# Patient Record
Sex: Male | Born: 1995 | Hispanic: No | Marital: Single | Smoking: Never smoker
Health system: Southern US, Community
[De-identification: ages and names within clinical notes are randomized; demographics above are authoritative.]

## PROBLEM LIST (undated history)

## (undated) DIAGNOSIS — H1010 Acute atopic conjunctivitis, unspecified eye: Secondary | ICD-10-CM

## (undated) DIAGNOSIS — L709 Acne, unspecified: Secondary | ICD-10-CM

## (undated) HISTORY — DX: Acne, unspecified: L70.9

## (undated) HISTORY — DX: Acute atopic conjunctivitis, unspecified eye: H10.10

---
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
## 2016-10-10 ENCOUNTER — Encounter: Payer: Self-pay | Admitting: Student

## 2016-10-10 ENCOUNTER — Ambulatory Visit (INDEPENDENT_AMBULATORY_CARE_PROVIDER_SITE_OTHER): Payer: Medicaid Other | Admitting: Student

## 2016-10-10 VITALS — BP 100/74 | Ht 65.0 in | Wt 181.6 lb

## 2016-10-10 DIAGNOSIS — Z113 Encounter for screening for infections with a predominantly sexual mode of transmission: Secondary | ICD-10-CM | POA: Diagnosis not present

## 2016-10-10 DIAGNOSIS — Z0001 Encounter for general adult medical examination with abnormal findings: Secondary | ICD-10-CM

## 2016-10-10 DIAGNOSIS — Z68.41 Body mass index (BMI) pediatric, 85th percentile to less than 95th percentile for age: Secondary | ICD-10-CM | POA: Diagnosis not present

## 2016-10-10 DIAGNOSIS — L700 Acne vulgaris: Secondary | ICD-10-CM

## 2016-10-10 DIAGNOSIS — E669 Obesity, unspecified: Secondary | ICD-10-CM | POA: Insufficient documentation

## 2016-10-10 DIAGNOSIS — E663 Overweight: Secondary | ICD-10-CM | POA: Diagnosis not present

## 2016-10-10 DIAGNOSIS — L853 Xerosis cutis: Secondary | ICD-10-CM

## 2016-10-10 LAB — LIPID PANEL
Cholesterol: 150 mg/dL (ref <170)
HDL: 35 mg/dL — ABNORMAL LOW (ref >45)
LDL CALC: 101 mg/dL (ref <110)
TRIGLYCERIDES: 69 mg/dL (ref <90)
Total CHOL/HDL Ratio: 4.3 Ratio (ref <5.0)
VLDL: 14 mg/dL (ref <30)

## 2016-10-10 LAB — COMPREHENSIVE METABOLIC PANEL
ALT: 20 U/L (ref 8–46)
AST: 27 U/L (ref 12–32)
Albumin: 4.3 g/dL (ref 3.6–5.1)
Alkaline Phosphatase: 76 U/L (ref 48–230)
BUN: 18 mg/dL (ref 7–20)
CO2: 26 mmol/L (ref 20–31)
Calcium: 9.4 mg/dL (ref 8.9–10.4)
Chloride: 102 mmol/L (ref 98–110)
Creat: 1.07 mg/dL (ref 0.60–1.26)
GLUCOSE: 87 mg/dL (ref 65–99)
Potassium: 4.1 mmol/L (ref 3.8–5.1)
SODIUM: 137 mmol/L (ref 135–146)
TOTAL PROTEIN: 7.5 g/dL (ref 6.3–8.2)
Total Bilirubin: 0.3 mg/dL (ref 0.2–1.1)

## 2016-10-10 LAB — POCT RAPID HIV: Rapid HIV, POC: NEGATIVE

## 2016-10-10 MED ORDER — ADAPALENE 0.1 % EX GEL: Freq: Every day | CUTANEOUS | 0 refills | Status: DC

## 2016-10-10 NOTE — Patient Instructions (Signed)
Acne Plan  Products: Face Wash:  Use a gentle cleanser, such as Cetaphil (generic version of this is fine) Moisturizer:  Use an "oil-free" moisturizer with SPF Prescription Cream(s): differin at bedtime  Morning: Wash face, then completely dry Apply Moisturizer to entire face  Bedtime: Wash face, then completely dry Apply small, pea size amount that you massage into problem areas on the face.  Remember: - Your acne will probably get worse before it gets better - It takes at least 2 months for the medicines to start working - Use oil free soaps and lotions; these can be over the counter or store-brand - Don't use harsh scrubs or astringents, these can make skin irritation and acne worse - Moisturize daily with oil free lotion because the acne medicines will dry your skin  Call your doctor if you have: - Lots of skin dryness or redness that doesn't get better if you use a moisturizer or if you use the prescription cream or lotion every other day    Stop using the acne medicine immediately and see your doctor if you are or become pregnant or if you think you had an allergic reaction (itchy rash, difficulty breathing, nausea, vomiting) to your acne medication.  Diet Recommendations   Starchy (carb) foods include: Bread, rice, pasta, potatoes, corn, crackers, bagels, muffins, all baked goods.   Protein foods include: Meat, fish, poultry, eggs, dairy foods, and beans such as pinto and kidney beans (beans also provide carbohydrate).   1. Eat at least 3 meals and 1-2 snacks per day. Never go more than 4-5 hours while     awake without eating.  2. Limit starchy foods to TWO per meal and ONE per snack. ONE portion of a starchy     food is equal to the following:  - ONE slice of bread (or its equivalent, such as half of a hamburger bun).  - 1/2 cup of a "scoopable" starchy food such as potatoes or rice.  - 1 OUNCE (28 grams) of starchy snack foods  such as crackers or pretzels (look     on label).  - 15 grams of carbohydrate as shown on food label.  3. Both lunch and dinner should include a protein food, a carb food, and vegetables.  - Obtain twice as many veg's as protein or carbohydrate foods for both lunch and     dinner.  - Try to keep frozen veg's on hand for a quick vegetable serving.  - Fresh or frozen veg's are best.  4. Breakfast should always include protein

## 2016-10-10 NOTE — Progress Notes (Signed)
Adolescent Well Care Visit Keith Odonnell is a 21 y.o. male who is here for well care.     PCP:  Gregor Hams, NP   History was provided by the patient.  Family is from [place]. Was born there but moved here after. Has not visited there and no one around him has as well.   Current Issues: Current concerns include: None, thinks he is doing well   Nutrition: Nutrition/Eating Behaviors: mom cooks every night, drinks smoothies and fruit in AM. Drinks mostly water. Does not like much vegetables   Exercise/ Media: Play any Sports?:  trying to work out everyday. Wanted to do with brother. 1 hour everyday at house. started 2 weeks ago  Screen Time:  < 2 hours , don't play games anymore (video)  Sleep:  Sleep: sleeps good at night. Does not snore.   Social Screening: Lives with:  Parents with 5 siblings. Second oldest Started working at [employer] 1 year ago, part time   Education: School Name: goes to [employer], english major  Going well  Unsure of what he wants to do in AM  Patient has a dental home: goes to dentist, went 2 weeks ago, has braces for past year   Confidentiality was discussed with the patient and, if applicable, with caregiver as well. Patient's personal or confidential phone number:  [phone]  Tobacco?  no Secondhand smoke exposure?  no Drugs/ETOH?  no  Sexually Active?  yes - last was 1 year ago, in February, states uses condoms all of the time   Pregnancy Prevention: condoms   Safe at home, in school & in relationships?  Yes Safe to self?  Yes   Screenings:  The patient completed the Rapid Assessment for Adolescent Preventive Services screening questionnaire and the following topics were identified as risk factors and discussed: healthy eating  In addition, the following topics were discussed as part of anticipatory guidance healthy eating, exercise and condom use.  PHQ-9 completed and results indicated patient was tired at times and with trouble  falling asleep. Attributes this due to part time job.   Physical Exam:  Vitals:   10/10/16 1009  BP: 100/74  Weight: 181 lb 9.6 oz (82.4 kg)  Height: 5\' 5"  (1.651 m)   BP 100/74   Ht 5\' 5"  (1.651 m)   Wt 181 lb 9.6 oz (82.4 kg)   BMI 30.22 kg/m  Body mass index: body mass index is 30.22 kg/m. Blood pressure percentiles are 5 % systolic and 40 % diastolic based on NHBPEP's 4th Report. Blood pressure percentile targets: 90: 131/92, 95: 135/96, 99 + 5 mmHg: 147/109.   Hearing Screening   Method: Audiometry   125Hz  250Hz  500Hz  1000Hz  2000Hz  3000Hz  4000Hz  6000Hz  8000Hz   Right ear:   20 20 20  20     Left ear:   20 20 20  20       Visual Acuity Screening   Right eye Left eye Both eyes  Without correction: 20/20 20/25   With correction:       Physical Exam   Gen:  Well-appearing, in no acute distress. Sitting on exam table, interactive in exam  HEENT:  Normocephalic, atraumatic. EOMI. Ears, nose and oropharynx normal. Braces present.  MMM. Neck supple, no lymphadenopathy.   CV: Regular rate and rhythm, no murmurs rubs or gallops. PULM: Clear to auscultation bilaterally. No wheezes/rales or rhonchi ABD: Soft, non tender, non distended, normal bowel sounds.  EXT: Well perfused, capillary refill < 3sec. GU: deferred  Neuro:  Grossly intact. No neurologic focalization. No abnormalities on back. Normal gait.  Skin: Warm, dry - diffusely, papular acne present diffusely on face  Assessment and Plan:    BMI is not appropriate for age  Hearing screening result:normal Vision screening result: normal  1. Encounter for general adult medical examination with abnormal findings Too old to receive flu here, advised where to obtain   2. 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 Patient aware that he has and thought due to being "lazy" Asked how much weight to lose, told to focus on healthy living 1. To try to  help with cooking and eating more vegetables 2. WATER WATER WATER 3. Continue to be as active as he is   Did below due to BMI and age. Used color coded BMI chart.   - Lipid panel - Comprehensive metabolic panel - Hemoglobin A1c - VITAMIN D 25 Hydroxy (Vit-D Deficiency, Fractures)  3. Acne vulgaris Patient previously on benzaclin, tried for months and did not help Will do below with gentle wash and SPF lotion  - adapalene (DIFFERIN) 0.1 % gel; Apply topically at bedtime.  Dispense: 45 g; Refill: 0  4. Routine screening for STI (sexually transmitted infection) Counseled on condom use, declined to take any  - GC/Chlamydia Probe Amp - POCT Rapid HIV (negative)  5. Dry skin Discussed soaps and moisturizers to use as using lotion   Return if symptoms worsen or fail to improve. - [facility])  Warnell ForesterAkilah Bridgitt Raggio, MD

## 2016-10-11 LAB — HEMOGLOBIN A1C
HEMOGLOBIN A1C: 5.8 % — AB (ref <5.7)
Mean Plasma Glucose: 120 mg/dL

## 2016-10-11 LAB — GC/CHLAMYDIA PROBE AMP
CT Probe RNA: NOT DETECTED
GC Probe RNA: NOT DETECTED

## 2016-10-11 LAB — VITAMIN D 25 HYDROXY (VIT D DEFICIENCY, FRACTURES): Vit D, 25-Hydroxy: 14 ng/mL — ABNORMAL LOW (ref 30–100)

## 2020-02-09 ENCOUNTER — Encounter: Payer: Self-pay | Admitting: Pediatrics

## 2024-01-30 ENCOUNTER — Ambulatory Visit (HOSPITAL_COMMUNITY)
Admission: EM | Admit: 2024-01-30 | Discharge: 2024-01-30 | Disposition: A | Discharge status: Home / Self Care | Attending: Psychiatry | Admitting: Psychiatry

## 2024-01-30 DIAGNOSIS — F411 Generalized anxiety disorder: Secondary | ICD-10-CM

## 2024-01-30 DIAGNOSIS — F331 Major depressive disorder, recurrent, moderate: Secondary | ICD-10-CM

## 2024-01-30 MED ORDER — HYDROXYZINE HCL 25 MG PO TABS: 25.0000 mg | ORAL_TABLET | Freq: Four times a day (QID) | ORAL | 0 refills | Status: AC | PRN

## 2024-01-30 NOTE — ED Notes (Signed)
 Patient discharged in no acute distress. AVS given and reviewed. Pt voiced understanding.

## 2024-01-30 NOTE — Discharge Instructions (Signed)

## 2024-01-30 NOTE — ED Provider Notes (Signed)
 Behavioral Health Urgent Care Medical Screening Exam  Patient Name: Keith Odonnell MRN: [id number] Date of Evaluation: 01/30/24 Chief Complaint:   Diagnosis:  Final diagnoses:  MDD (major depressive disorder), recurrent episode, moderate (HCC)  Anxiety state    History of Present illness: Keith Odonnell is a 28 y.o. male.   Patient presents to [facility] voluntarily, reporting that he has been experiencing "a brain fog". He reports that he has not been feeling well ever since he used "shrooms" a year ago. Patient reports that he has lost focus and unable to concentrate on tasks. Has been experiencing difficulty focusing on school and has recently dropped all his courses at [employer]. Patient reports his brain is not functioning well. He was planning to finish school but unable to keep up with his tasks and failed all the courses.  Patient reports he has seen a provider who prescribed him Buspar 15 mg PO BID and he has been taking it. However, his symptoms have not improved. Additional stressors include being unemployed, feeling like a disappointment to his family, and financial struggle. Patient reports depressive symptoms including isolation, loss of motivation and irritability. Patient reports he does not want to seethe provider who prescribed him Buspar. He reports he will continue to take this medication but would like to have a new provider "I don't even have insurance".  He admits to using Marijuana on daily basis until 2 weeks ago when he decided to stop. Reports  he does not use any other substances. Reports he ony used mushrooms once and has not felt well since.   Face-to-face evaluation completed and Dr Docia Freeman consulted. Keith Odonnell is a 28 year-old male who is sitting in the assessment room, restless and anxious. He appears to be helpless. He is casually dressed with decent hygiene. Alert and oriented x 4. His speech is clear and well articulated. Thought process is coherent and goal-directed. His  eye contact is fair. Patient denies SI/HI/AVH. He shares that his mental health has not been the same since he used mushrooms a year ago. Reports this has affected hi entire life and he is having difficulty accomplishing tasks. Recently dropped his courses due to failing "my brain doesn't function anymore". Patient was seeing a provider in [facility] system but he is not willing to return there. Reports he experiences symptoms like headaches, weird thinking and poor judgement. Reports symptoms increase whenever he stops using Marijuana. Reports he tends to keep his emotional pain to himself and "I don't have friends".  He reports that his older brother has a substance-induced psychosis. Patient lives with his mom and siblings. His parents are divorced and he has not talked to his dad for months.  Patient denies health issues and believes his light headaches are caused by his hx of mushroom use.   Patient reports he is willing to continue his Buspar: Dr Docia Freeman recommends increase to 15 mg PO TID. Patient is looking for outpatient services and willing to establish with [facility]. He is willing to present there in AM for initial encounters.   Flowsheet Row ED from 01/30/2024 in [facility]  C-SSRS RISK CATEGORY No Risk       Psychiatric Specialty Exam  Presentation  General Appearance:Casual  Eye Contact:Fair  Speech:Clear and Coherent  Speech Volume:Normal  Handedness:Right   Mood and Affect  Mood: Anxious; Depressed; Irritable  Affect: Depressed   Thought Process  Thought Processes: Coherent  Descriptions of Associations:Intact  Orientation:Full (Time, Place and Person)  Thought Content:WDL    Hallucinations:None  Ideas of Reference:None  Suicidal Thoughts:No  Homicidal Thoughts:No   Sensorium  Memory: Immediate Fair; Recent Fair; Remote Fair  Judgment: Fair  Insight: Fair   Art therapist  Concentration: Fair  Attention  Span: Fair  Recall: [employer] of Knowledge: Fair  Language: Fair   Psychomotor Activity  Psychomotor Activity: Restlessness   Assets  Assets: Manufacturing systems engineer; Desire for Improvement; Physical Health; Housing   Sleep  Sleep: Fair  Number of hours: No data recorded  Physical Exam: Physical Exam Vitals and nursing note reviewed.  Constitutional:      Appearance: Normal appearance.  HENT:     Head: Normocephalic.     Right Ear: Tympanic membrane normal.     Left Ear: Tympanic membrane normal.     Nose: Nose normal.     Mouth/Throat:     Mouth: Mucous membranes are moist.  Eyes:     Extraocular Movements: Extraocular movements intact.     Pupils: Pupils are equal, round, and reactive to light.  Cardiovascular:     Rate and Rhythm: Normal rate.     Pulses: Normal pulses.  Pulmonary:     Effort: Pulmonary effort is normal.  Musculoskeletal:        General: Normal range of motion.     Cervical back: Neck supple.  Neurological:     General: No focal deficit present.     Mental Status: He is alert and oriented to person, place, and time.    Review of Systems  Constitutional: Negative.   HENT: Negative.    Eyes: Negative.   Respiratory: Negative.    Cardiovascular: Negative.   Gastrointestinal: Negative.   Genitourinary: Negative.   Musculoskeletal: Negative.   Skin: Negative.   Neurological: Negative.   Endo/Heme/Allergies: Negative.   Psychiatric/Behavioral:  Positive for depression and substance abuse. The patient is nervous/anxious.    Blood pressure 120/84, pulse 84, temperature 98.3 F (36.8 C), resp. rate 17, SpO2 100%. There is no height or weight on file to calculate BMI.  Musculoskeletal: Strength & Muscle Tone: within normal limits Gait & Station: normal Patient leans: N/A   [facility] MSE Discharge Disposition for Follow up and Recommendations: Based on my evaluation the patient does not appear to have an emergency medical condition  and can be discharged with resources and follow up care in outpatient services for Medication Management, Substance Abuse Intensive Outpatient Program, Individual Therapy, and Group Therapy   Elston Halsted, NP 01/30/2024, 2:45 PM

## 2024-01-30 NOTE — Progress Notes (Signed)
   01/30/24 1232  [facility] Triage Screening (Walk-ins at [facility] only)  How Did You Hear About Us ? Self  What Is the Reason for Your Visit/Call Today? Keith Odonnell is a 28 year old male with a self-reported history of depression and anxiety presenting to [facility] voluntarily with chief complaint of "brain fog" since 2023 when he took shrooms and had a "bad trip". Patient reports the brain fog has worsened over the past three months which has affected his performance at school. Patient reports that he was supposed to graduate this year, but he does not have enough credits to do so. Patient reports having a hard time focusing, issues with his memory and feeling like he is having "psychedelic side effects or "body feels like it is on a bad (drug) trip" without using drugs. Patient reports the brain fog usually happens in the morning along with headaches.  Patient reports that his PCP prescribed him medications for depression and anxiety (possibly buspirone) which he has been on for the past year. Patient endorses depressive symptoms of isolating, crying, sadness, sometimes feeling hopeless and worthless. Report sleep and appetite are normal. Patient denies SI,HI, AVH. Patient does not have outpatient services and no history of psychiatric inpatient treatment. Patient reports smoking a blunt of THC daily and denies use of shrooms since 2023. Patient denies alcohol use. Additional stressors include being unemployed for the past 3-4 months. Patient does not appear, psychotic, delusional or manic during assessment but is stuttering and somewhat anxious.  How Long Has This Been Causing You Problems? > than 6 months  Have You Recently Had Any Thoughts About Hurting Yourself? No  Are You Planning to Commit Suicide/Harm Yourself At This time? No  Have you Recently Had Thoughts About Hurting Someone Marigene Shoulder? No  Are You Planning To Harm Someone At This Time? No  Physical Abuse Denies  Verbal Abuse Denies  Sexual Abuse Denies   Exploitation of patient/patient's resources Denies  Self-Neglect Denies  Are you currently experiencing any auditory, visual or other hallucinations? No  Have You Used Any Alcohol or Drugs in the Past 24 Hours? Yes  What Did You Use and How Much? THC blunt  Do you have any current medical co-morbidities that require immediate attention? No  Clinician description of patient physical appearance/behavior: calm, stuttering, a bit anxious  What Do You Feel Would Help You the Most Today? Treatment for Depression or other mood problem  If access to [facility] was not available, would you have sought care in the Emergency Department? No  Determination of Need Routine (7 days)  Options For Referral Outpatient Therapy;Medication Management

## 2024-01-31 ENCOUNTER — Ambulatory Visit (HOSPITAL_COMMUNITY): Admitting: Mental Health

## 2024-01-31 DIAGNOSIS — F320 Major depressive disorder, single episode, mild: Secondary | ICD-10-CM | POA: Diagnosis not present

## 2024-01-31 DIAGNOSIS — F411 Generalized anxiety disorder: Secondary | ICD-10-CM | POA: Insufficient documentation

## 2024-01-31 NOTE — Progress Notes (Signed)
 Comprehensive Clinical Assessment (CCA) Note  01/31/2024 Keith Odonnell [id number]  Chief Complaint:  Chief Complaint  Patient presents with   Establish Care   Visit Diagnosis: Major depression mild    CCA Screening, Triage and Referral (STR)  Patient Reported Information How did you hear about us ? Self  Whom do you see for routine medical problems? Primary Care  What Is the Reason for Your Visit/Call Today? "Brain fog." Notes concerns for depression, anxiety, memory prblems and poor concentration.  How Long Has This Been Causing You Problems? > than 6 months  What Do You Feel Would Help You the Most Today? Treatment for Depression or other mood problem   Have You Recently Been in Any Inpatient Treatment (Hospital/Detox/Crisis Center/28-Day Program)? No  Have You Ever Received Services From [employer] Before? No  Have You Recently Had Any Thoughts About Hurting Yourself? No  Are You Planning to Commit Suicide/Harm Yourself At This time? No   Have you Recently Had Thoughts About Hurting Someone Marigene Shoulder? No  Have You Used Any Alcohol or Drugs in the Past 24 Hours? No  How Long Ago Did You Use Drugs or Alcohol? Denies   What Did You Use and How Much? NA  Do You Currently Have a Therapist/Psychiatrist? No  Have You Been Recently Discharged From Any Office Practice or Programs? No     CCA Screening Triage Referral Assessment Type of Contact: Face-to-Face  Collateral Involvement: Chart review  Is CPS involved or ever been involved? Never  Is APS involved or ever been involved? Never   Patient Determined To Be At Risk for Harm To Self or Others Based on Review of Patient Reported Information or Presenting Complaint? No  Method: No Plan  Availability of Means: No access or NA  Intent: Vague intent or NA  Notification Required: No need or identified person  Are There Guns or Other Weapons in Your Home? No  Types of Guns/Weapons: NA  Who Could Verify You  Are Able To Have These Secured: NA  Do You Have any Outstanding Charges, Pending Court Dates, Parole/Probation? Denies  Location of Assessment: [facility]  Does Patient Present under Involuntary Commitment? No  [place] of Residence: [place]  Patient Currently Receiving the Following Services: Not Receiving Services  Determination of Need: Routine (7 days)  Options For Referral: Medication Management; Outpatient Therapy     CCA Biopsychosocial Intake/Chief Complaint:  "Brain fog." Notes concerns for depression, anxiety, memory problems and poor concentration.  Keith Odonnell is a 28 year old African-American male who presents for routine walk in assessment to engage in outpatient therapy services wtih [facility]. Shares hx of outpatient services in 2023 and notes was experiencing concerns related depression and anxiety at that time. Shares to have had therapy services on x 2 other occasions, denies hx of inpatient treatment. Notes to take busprirone 15mg  provided by PCP. Shares feelings of depression and anxiety dating back to 28 years of age; triggered by bullying in school. Reports additional concern of brain fog which started following Septmeber 2023 when he experiences a bad "trip" off the use of mushrooms. Notes current stressors related to school and finances, reporting to not currently be working. Recently presented to [facility] 01/30/2024 and was referred for outpatinet services and prescribed hydroxyzine .  Current Symptoms/Problems: depression anxiety memory problems and poor concentration   Patient Reported Schizophrenia/Schizoaffective Diagnosis in Past: No   Strengths: "very altruistic; outgoing."  Preferences: morning appointments  Abilities: "keeping myself going, happy I guess."  Type of Services Patient Feels are Needed: Needs: "being consistent"   Initial Clinical Notes/Concerns: MDD mild   Mental Health Symptoms Depression:  Weight gain/loss;  Increase/decrease in appetite; Sleep (too much or little); Difficulty Concentrating; Change in energy/activity (weight loss; decreased appetite. Low mood; low motivation, some anheodnia. Recent interupted sleep Denies hx of SI or suical actions, denies self harm behaviors)   Duration of Depressive symptoms: Greater than two weeks   Mania:  None   Anxiety:   Worrying; Difficulty concentrating (over thining)   Psychosis:  None   Duration of Psychotic symptoms: No data recorded  Trauma:  None   Obsessions:  None   Compulsions:  None   Inattention:  None   Hyperactivity/Impulsivity:  None   Oppositional/Defiant Behaviors:  None   Emotional Irregularity:  None   Other Mood/Personality Symptoms:  No data recorded   Mental Status Exam Appearance and self-care  Stature:  Average   Weight:  Average weight   Clothing:  Casual   Grooming:  Normal   Cosmetic use:  None   Posture/gait:  Stooped   Motor activity:  Not Remarkable   Sensorium  Attention:  Normal   Concentration:  Normal   Orientation:  X5   Recall/memory:  Normal   Affect and Mood  Affect:  Blunted; Flat   Mood:  Other (Comment) (stable)   Relating  Eye contact:  Normal   Facial expression:  Responsive   Attitude toward examiner:  Cooperative   Thought and Language  Speech flow: Clear and Coherent   Thought content:  Appropriate to Mood and Circumstances   Preoccupation:  None   Hallucinations:  None   Organization:  No data recorded  [employer] of Knowledge:  Fair   Intelligence:  Average   Abstraction:  Normal   Judgement:  Good   Reality Testing:  Realistic   Insight:  Good   Decision Making:  Normal   Social Functioning  Social Maturity:  Isolates   Social Judgement:  Normal   Stress  Stressors:  Surveyor, quantity; School (currently unemployment; school - shares not able to graduate this year)   Coping Ability:  Normal   Skill Deficits:  None   Supports:   Family; Friends/Service system     Religion: Religion/Spirituality Are You A Religious Person?: No  Leisure/Recreation: Leisure / Recreation Do You Have Hobbies?: Yes Leisure and Hobbies: going to the gym, gaming, photography  Exercise/Diet: Exercise/Diet Do You Exercise?: No Have You Gained or Lost A Significant Amount of Weight in the Past Six Months?: Yes-Lost Number of Pounds Lost?: 10 Do You Follow a Special Diet?: No Do You Have Any Trouble Sleeping?: No   CCA Employment/Education Employment/Work Situation: Employment / Work Situation Employment Situation: Unemployed (has not worked in the past x 4 months ago; was working for [employer]- ended) Patient's Job has Been Impacted by Current Illness: No What is the Longest Time Patient has Held a Job?: 4 years 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 Did You Have Any Special Interests In School?: would like to finish and move foward with education Did You Have An Individualized Education Program (IIEP): No Did You Have Any Difficulty  At [facility]?: No Patient's Education Has Been Impacted by Current Illness: No   CCA Family/Childhood History Family and Relationship History: Family history Marital status: Single Are you sexually active?: No What is your sexual orientation?: heterosexual Does patient have children?: No  Childhood History:  Childhood History Additional childhood history information: Kalif was raise by his mother in [place]. Describes chldhood " My parent got divorced at 51, my childhood was pretty good, pretty average." Description of patient's relationship with caregiver when they were a  child: Mother: "pretty good, she was strict." Father: " I didnt see himmuch" Patient's description of current relationship with people who raised him/her: Mother: "pretty good, she is supportive." Father: denies to have seen him in a while Does patient have siblings?: Yes Number of Siblings: 5 (x 3 brothers; x 2 sisters) Description of patient's current relationship with siblings: shares to have a good relationship with siblings. Did patient suffer any verbal/emotional/physical/sexual abuse as a child?: No Did patient suffer from severe childhood neglect?: No Has patient ever been sexually abused/assaulted/raped as an adolescent or adult?: No Was the patient ever a victim of a crime or a disaster?: No Witnessed domestic violence?: No Has patient been affected by domestic violence as an adult?: No  Child/Adolescent Assessment:     CCA Substance Use Alcohol/Drug Use: Alcohol / Drug Use Prescriptions: See MAR History of alcohol / drug use?: Yes ([facility] note reportes use of THC) Substance #1 Name of Substance 1: Alcohol 1 - Age of First Use: 25 1 - Amount (size/oz): one to two drinks 1 - Frequency: once weekly 1 - Duration: 3  years 1 - Last Use / Amount: last week - one drink 1 - Method of Aquiring: purchase 1- Route of Use: drinking                       ASAM's:  Six Dimensions of Multidimensional Assessment  Dimension 1:  Acute Intoxication and/or Withdrawal Potential:      Dimension 2:  Biomedical Conditions and Complications:      Dimension 3:  Emotional, Behavioral, or Cognitive Conditions and Complications:     Dimension 4:  Readiness to Change:     Dimension 5:  Relapse, Continued use, or Continued Problem Potential:     Dimension 6:  Recovery/Living Environment:     ASAM Severity Score:    ASAM Recommended Level of Treatment:     Substance use Disorder (SUD)    Recommendations for Services/Supports/Treatments: Recommendations for  Services/Supports/Treatments Recommendations For Services/Supports/Treatments: Medication Management, Individual Therapy  DSM5 Diagnoses: Patient Active Problem List   Diagnosis Date Noted   Mild major depression (HCC) 01/31/2024   Anxiety state 01/31/2024   Obesity, pediatric, 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 Shares hx of outpatient services in 2023 and notes was experiencing concerns related depression and anxiety at that time. Shares to have had therapy services on x 2 other occasions, denies hx of inpatient treatment. Notes to take busprirone 15mg  provided by PCP. Shares feelings of depression and anxiety dating back to 28 years of age; triggered by bullying in school. Reports additional concern of brain fog which started following Septmeber 2023 when he experiences a bad "trip" off the use of mushrooms.  Notes current stressors related to school and finances, reporting to not currently be working. Recently presented to [facility] 01/30/2024 and was referred for outpatinet services and prescribed hydroxyzine .   Tyge presents for walk in assessment alert and oriented; mood and affect adequate;blunted. Speech clear and coherent at normal rate and tone. Engaged and cooperative with assessment. Notes feelings of low mood with weight loss, decreased appetite, recent fluctuations in sleep, low energy with poor concentration and brain fog. Denies hx of suicidal thoughts or self-harm behaviors. Notes anxiousness with excessive worry and over thinking behaviors. Denies mood swings/mania. Denies trauma hx or trauma sxs. No psychotic sxs observed or reported. Denies concerns managing anger or irritable outburst. Shares use of alcohol at once weekly reate of one or two  drinks. Denies use of other substances currently. Previous presentation to [facility], [facility] reported. Not currently in the work force and shares financial stressors. Reports additional stressors related to school. Denies SI/HI. CSSRS, pain, nutrition, GAD and PHQ reported.   Meets criteria for Major depression mild with anxious distress  Txt plan will be completed at next session.      01/31/2024    9:07 AM  GAD 7 : Generalized Anxiety Score  Nervous, Anxious, on Edge 1  Control/stop worrying 0  Worry too much - different things 1  Trouble relaxing 1  Restless 0  Easily annoyed or irritable 0  Afraid - awful might happen 0  Total GAD 7 Score 3  Anxiety Difficulty Somewhat difficult       01/31/2024    9:07 AM 01/30/2024    2:44 PM  Depression screen PHQ 2/9  Decreased Interest 0 1  Down, Depressed, Hopeless  1  PHQ - 2 Score 0 2  Altered sleeping 0 0  Tired, decreased energy 0 1  Change in appetite 0 0  Feeling bad or failure about yourself  1 0  Trouble concentrating 0 2  Moving slowly or fidgety/restless 1 2  Suicidal thoughts 0 0  PHQ-9 Score 2 7  Difficult doing work/chores Somewhat difficult Very difficult    Patient Centered Plan: Patient is on the following Treatment Plan(s):  Anxiety and Depression   Referrals to Alternative Service(s): Referred to Alternative Service(s):   Place:   Date:   Time:    Referred to Alternative Service(s):   Place:   Date:   Time:    Referred to Alternative Service(s):   Place:   Date:   Time:    Referred to Alternative Service(s):   Place:   Date:   Time:      Collaboration of Care: Medication Management AEB Referral for psychiatric evaluation  Patient/Guardian was advised Release of Information must be obtained prior to any record release in order to collaborate their care with an outside provider. Patient/Guardian was advised if they have not already done so to contact the registration department to sign all necessary forms in order for  us  to release information regarding their care.   Consent: Patient/Guardian gives verbal consent for treatment and assignment of benefits for services provided during this visit. Patient/Guardian expressed understanding and agreed to proceed.   Loman Risk, [facility]

## 2024-03-14 NOTE — Progress Notes (Deleted)
 Psychiatric Initial Adult Assessment  Date: 03/18/2024, 1:32 PM  Patient Identification: Keith Odonnell MRN: [id number] DOB: [birth year]  Referral Source: Nicklas Barns, MD   ASSESSMENT / PLAN  Keith Odonnell is a 28 y.o. male with PMH of ***, *** suicide attempt, *** inpt psych admission, who presented in person for psychiatric evaluation of  brain fog   Risk Assessment A suicide and violence risk assessment was performed as part of this evaluation. There patient is deemed to be at chronic elevated risk for self-harm/suicide given the following factors: {SABSUICIDERISKFACTORS:29780}. These risk factors are mitigated by the following factors: {SABSUICIDEPROTECTIVEFACTORS:29779}. The patient is deemed to be at chronic elevated risk for violence given the following factors: {SABVIOLENCERISKFACTORS:29781}. These risk factors are mitigated by the following factors: {SABVIOLENCEPROTECTIVEFACTORS:29782}. There is no *** acute risk for suicide or violence at this time. The patient was educated about relevant modifiable risk factors including following recommendations for treatment of psychiatric illness and abstaining from substance abuse.  While future psychiatric events cannot be accurately predicted, the patient does not *** currently require  acute inpatient psychiatric care and does not *** currently meet [place]  involuntary commitment criteria.    There are no diagnoses linked to this encounter.  Follow-up on: 04/01/2024  Future Appointments  Date Time Provider Department Center  03/18/2024  8:30 AM Augusta Blizzard, MD [facility] None  04/01/2024  9:00 AM Coni Deep, [facility] None     Patient was given contact information for behavioral health clinic and was instructed to call 911 for emergencies.     HISTORY OF PRESENT ILLNESS  Chief Complaint: No chief complaint on file.    ***  Patient amenable to *** after discussing the risks, benefits, and side  effects. Otherwise patient had no other questions or concerns and was amenable to plan per above.  Patient's main concern: ***  Patient's goal: ***  Safety: ***. Patient contracted to safety, would call ***. Patient *** aware of [facility], 988 and 911 as well.  *** access to guns or weapons.  Current outpatient therapist: *** Current rx: ***  ROS   PSYCH ROS  Depression: *** depressed mood and pervasive sadness, anhedonia, insomnia***, hypersomnia***, guilt, decreased energy, decreased concentration, decreased*** or increased*** appetite, psychomotor slowing*** restlessness***, and suicidal ideation or intentions Duration of Depression Symptoms: Greater than two weeks  (Hypo-) Mania: *** excessive energy despite decreased need for sleep or persistent irritability (<2hr/night x4-7days), grandiosity/inflated self-esteem, sexual indiscretion, racing thoughts, pressured speech, distractibility/inattention. Anxiety: *** having difficulty controlling/managing anxiety/worry/stress and that it is out of proportion with stressors. *** associated sxs of restlessness, being on edge, easily fatigued, concentration difficulty, irritability, muscle tension, sleep disturbance.  Psychosis:  *** AVH, delusions, paranoia, first rank symptoms.  Trauma:  Trauma: *** life-threatening, physical, sexual, witnessed *** flashbacks, nightmares, hypervigilance/hyperarousal, avoidance.  Acute stress d/o 0-3d *** Adjustment d/o 3d-52mo PTSD >48mo  Eating: ***  *** intense fear of gaining weight, perception of being fat, restricting to lose weight. *** overeating in one sitting, unable to stop eating or losing track of time while eating, compensates by restricting, over-exercising, vomiting, laxative misuse, diuretic misuse.   Mild: 1 to 3 episodes of inappropriate compensatory behaviours per week. Moderate: 4 to 7 episodes  Severe: 8 to 13 episodes  Extreme: 14 or more episodes   S: Do you ever make yourself  sick because you feel uncomfortably full? C: Do you worry you have lost control over how much you eat? O: Have you recently lost  more than one stone [14 pounds/6.4kg] in a 3 month period? F: Do you believe yourself to be fat when others say you are too thin? F: Would you say that food dominates your life?   PAST HISTORY   Past Psychiatric History:  Hospitalizations: *** Suicide attempts: *** NSSIB: *** Psychotherapy: *** Dx: *** Rx: *** Head trauma: ***  Past Medical History: Dx:  has a past medical history of Acne and Allergic conjunctivitis.  Head trauma: *** Seizures: *** Allergies: Patient has no known allergies.   Family Psychiatric History:  Suicide: *** Homicide: *** Hospitalization: *** BiPD: *** SCZ/SCzA: *** Others: ***  Social History:  Living with: *** Income: *** Marital Status: *** Children: *** Support: *** Guns/Weapons: *** Legal: *** DUI/DWI: *** Jail/prison: *** Developmental: ***  Substance Use History: EtOH:  reports no history of alcohol use.*** Nicotine:  reports that he has never smoked. He does not have any smokeless tobacco history on file.*** THC/CBD: *** IV drug use: *** Stimulants: *** Opiates: *** Sedative/hypnotics: *** Hallucinogens: *** Seizures: *** DT: *** Detox: *** Residential: ***  Substance Abuse History in the last 12 months:  {yes no:314532}    Past Medical History:  Past Medical History:  Diagnosis Date   Acne    Allergic conjunctivitis    No past surgical history on file.  Family History:  Family History  Problem Relation Age of Onset   Asthma Maternal Grandmother     Social History:   Social History   Socioeconomic History   Marital status: Single    Spouse name: Not on file   Number of children: Not on file   Years of education: Not on file   Highest education level: Not on file  Occupational History   Not on file  Tobacco Use   Smoking status: Never   Smokeless tobacco: Not on file   Substance and Sexual Activity   Alcohol use: No   Drug use: No   Sexual activity: Never  Other Topics Concern   Not on file  Social History Narrative   Lives with Mom, 3 brothers and 2 sisters.  His father lives in [place] and he sees him once a week.   Social Drivers of Corporate investment banker Strain: Low Risk  (11/29/2022)   Received from [facility]   Overall Financial Resource Strain (CARDIA)    Difficulty of Paying Living Expenses: Not very hard  Food Insecurity: No Food Insecurity (11/29/2022)   Received from [facility]   Hunger Vital Sign    Within the past 12 months, you worried that your food would run out before you got the money to buy more.: Never true    Within the past 12 months, the food you bought just didn't last and you didn't have money to get more.: Never true  Transportation Needs: No Transportation Needs (11/29/2022)   Received from [facility] - Transportation    Lack of Transportation (Medical): No    Lack of Transportation (Non-Medical): No  Physical Activity: Insufficiently Active (11/29/2022)   Received from [facility]   Exercise Vital Sign    On average, how many days per week do you engage in moderate to strenuous exercise (like a brisk walk)?: 3 days    On average, how many minutes do you engage in exercise at this level?: 30 min  Stress: No Stress Concern Present (11/29/2022)   Received from [facility] - Occupational Stress Questionnaire  Feeling of Stress : Only a little  Social Connections: Somewhat Isolated (11/29/2022)   Received from [facility]   Social Network    How would you rate your social network (family, work, friends)?: Restricted participation with some degree of social isolation    Allergies: No Known Allergies  Current Medications: Current Outpatient Medications  Medication Sig Dispense Refill   hydrOXYzine  (ATARAX ) 25 MG tablet Take 1 tablet (25 mg total) by  mouth every 6 (six) hours as needed. 30 tablet 0   hydrOXYzine  (ATARAX ) 25 MG tablet Take 1 tablet (25 mg total) by mouth every 6 (six) hours as needed. 30 tablet 0   No current facility-administered medications for this visit.        OBJECTIVE  There were no vitals taken for this visit.  Psychiatric Specialty Exam: General Appearance: Casual, faily groomed, not guarded ***  Eye Contact:  Good  Speech:  Clear, coherent, normal rate, non-pressured  Volume:  Normal  Mood:  ***  Affect:  Appropriate, congruent, full range   Thought Content: Logical, rumination. No command or non-command AVH, paranoid delusions, first rank sxs.   Suicidal Thoughts:  Denied active and passive SI  Homicidal Thoughts:  Denied active and passive HI  Thought Process:  Coherent, goal-directed, mostly linear, circumstantial at times ***  Orientation:  A&Ox4  Memory:  Immediate good  Judgement:  Fair  Insight:  Fair, shallow  Concentration:  Attention and concentration good ***  Recall:  [employer] of Knowledge:  Fair  Language:  Good, no aphasia  Psychomotor Activity:  Normal  Akathisia:  NA, no antipsychotics ***  AIMS (if indicated):  NA, no antipsychotics   ***  Assets:  {Assets (PAA):22698}  ADL's:  Intact  Cognition:  WNL  Sleep:  ***     Wt Readings from Last 3 Encounters:  10/10/16 181 lb 9.6 oz (82.4 kg) (81%, Z= 0.90)*  05/26/15 156 lb 12.8 oz (71.1 kg) (60%, Z= 0.24)*  11/24/13 153 lb 9.6 oz (69.7 kg) (66%, Z= 0.42)*   * Growth percentiles are based on CDC (Boys, 2-20 Years) data.   Temp Readings from Last 3 Encounters:  01/30/24 98.3 F (36.8 C)   BP Readings from Last 3 Encounters:  01/30/24 120/84  10/10/16 100/74  05/26/15 102/70   Pulse Readings from Last 3 Encounters:  01/30/24 84  11/24/13 62     Physical Exam  Strength & Muscle Tone: {desc; muscle tone:32375} Gait & Station: {PE GAIT ED ZOXW:96045}  Screenings:  GAD-7    Flowsheet Row Counselor from 01/31/2024  in [facility]  Total GAD-7 Score 3   PHQ2-9    Flowsheet Row Counselor from 01/31/2024 in [facility] ED from 01/30/2024 in [facility]  PHQ-2 Total Score 0 2  PHQ-9 Total Score 2 7   Flowsheet Row Counselor from 01/31/2024 in [facility] ED from 01/30/2024 in [facility]  C-SSRS RISK CATEGORY No Risk No Risk    Collaboration of Care: Case discussed with current outpatient attending, see attending's attestation for additional information   Signed: Augusta Blizzard, MD

## 2024-03-18 ENCOUNTER — Ambulatory Visit (HOSPITAL_COMMUNITY): Admitting: Student

## 2024-04-01 ENCOUNTER — Ambulatory Visit (INDEPENDENT_AMBULATORY_CARE_PROVIDER_SITE_OTHER): Admitting: Mental Health

## 2024-04-01 ENCOUNTER — Encounter (HOSPITAL_COMMUNITY): Payer: Self-pay

## 2024-04-01 DIAGNOSIS — F320 Major depressive disorder, single episode, mild: Secondary | ICD-10-CM

## 2024-04-01 NOTE — Progress Notes (Signed)
   THERAPIST PROGRESS NOTE  Session Time: 9:00am (31 minutes)  Participation Level: Minimal  Behavioral Response: CasualAlertWNL  Type of Therapy: Individual Therapy  Treatment Goals addressed: STG: Keith Odonnell will maintain stability in sxs AEB use of coping skills as needed and completion of tasks as needed for the next 90 days.  ProgressTowards Goals: Initial  Interventions: Supportive  Summary: Keith Odonnell is a 28 y.o. male who presents with dx of major depression mild. Presents alert and oriented; mood and affect adequate; neutral. Flat. Speech clear and coherent at normal rate and tone. Shares improvement in sxs of depression with current rating of depression 3/10 and anxiety 2/10. Notes ongoing chief complaint of brain fog noting to not feel as if he is as smart as he used to be following use of mushrooms x2 years ago. Notes daily use of cannabis but denies to feel as if this is related. Shares has started to work again. Notes to have started to engage in dating; reporting to have never had a romantic partner in the past secondary to self-esteem and confidence issues which has improved. Notes main coping skill to be journaling. Notes sxs stability and unclear of treatment goals but notes can have difficulty completing needed tasks for school due to lack of motivation and desire to engage in other things. Agrees to explore domains of life and overall functioning for areas of improvement. Agrees to general goal of maintaining stability. Denies safety concerns.    Suicidal/Homicidal: Nowithout intent/plan  Therapist Response: Therapist engaged Keith Odonnell in therapy session. Reviewed intake assessment, informed consent and bounds of confidentiality. Explored current level of functioning, sxs management and stressors. Discussed factors that supported in decrease in sxs and stability. Explored domains of life and level of functioning. Explored treatment planning and goal setting. Reviewed session  and provided follow up.   Plan: Return again in  x 7 weeks.  Diagnosis: Mild major depression (HCC)  Collaboration of Care: Other None  Patient/Guardian was advised Release of Information must be obtained prior to any record release in order to collaborate their care with an outside provider. Patient/Guardian was advised if they have not already done so to contact the registration department to sign all necessary forms in order for us  to release information regarding their care.   Consent: Patient/Guardian gives verbal consent for treatment and assignment of benefits for services provided during this visit. Patient/Guardian expressed understanding and agreed to proceed.   Keith Odonnell, [facility] 04/01/2024

## 2024-05-28 ENCOUNTER — Ambulatory Visit (HOSPITAL_COMMUNITY): Admitting: Mental Health
